# Patient Record
Sex: Female | Born: 1945 | Race: White | Hispanic: No | Marital: Married
Health system: Southern US, Community
[De-identification: ages and names within clinical notes are randomized; demographics above are authoritative.]

---

## 1998-04-23 ENCOUNTER — Ambulatory Visit (HOSPITAL_COMMUNITY): Admission: RE | Admit: 1998-04-23 | Discharge: 1998-04-23 | Payer: Self-pay | Admitting: Family Medicine

## 2012-09-12 DIAGNOSIS — M810 Age-related osteoporosis without current pathological fracture: Secondary | ICD-10-CM | POA: Insufficient documentation

## 2013-05-17 DIAGNOSIS — F430 Acute stress reaction: Secondary | ICD-10-CM | POA: Insufficient documentation

## 2013-06-12 DIAGNOSIS — G4700 Insomnia, unspecified: Secondary | ICD-10-CM | POA: Insufficient documentation

## 2013-06-12 DIAGNOSIS — F329 Major depressive disorder, single episode, unspecified: Secondary | ICD-10-CM | POA: Insufficient documentation

## 2013-06-12 DIAGNOSIS — F32A Depression, unspecified: Secondary | ICD-10-CM | POA: Insufficient documentation

## 2013-08-18 DIAGNOSIS — E039 Hypothyroidism, unspecified: Secondary | ICD-10-CM | POA: Insufficient documentation

## 2014-03-23 DIAGNOSIS — D72819 Decreased white blood cell count, unspecified: Secondary | ICD-10-CM | POA: Insufficient documentation

## 2014-04-05 DIAGNOSIS — R945 Abnormal results of liver function studies: Secondary | ICD-10-CM | POA: Insufficient documentation

## 2014-04-05 DIAGNOSIS — N390 Urinary tract infection, site not specified: Secondary | ICD-10-CM | POA: Insufficient documentation

## 2014-04-05 DIAGNOSIS — R7989 Other specified abnormal findings of blood chemistry: Secondary | ICD-10-CM | POA: Insufficient documentation

## 2014-04-06 DIAGNOSIS — R102 Pelvic and perineal pain: Secondary | ICD-10-CM | POA: Insufficient documentation

## 2014-07-06 DIAGNOSIS — K760 Fatty (change of) liver, not elsewhere classified: Secondary | ICD-10-CM | POA: Insufficient documentation

## 2015-06-26 ENCOUNTER — Ambulatory Visit (INDEPENDENT_AMBULATORY_CARE_PROVIDER_SITE_OTHER): Payer: Medicare Other

## 2015-06-26 ENCOUNTER — Ambulatory Visit (INDEPENDENT_AMBULATORY_CARE_PROVIDER_SITE_OTHER): Payer: Medicare Other | Admitting: Podiatry

## 2015-06-26 ENCOUNTER — Encounter: Payer: Self-pay | Admitting: Podiatry

## 2015-06-26 VITALS — BP 154/88 | HR 73 | Resp 16 | Ht 60.0 in | Wt 130.0 lb

## 2015-06-26 DIAGNOSIS — M79674 Pain in right toe(s): Secondary | ICD-10-CM

## 2015-06-26 DIAGNOSIS — M779 Enthesopathy, unspecified: Secondary | ICD-10-CM | POA: Diagnosis not present

## 2015-06-26 NOTE — Progress Notes (Signed)
   Subjective:    Patient ID: Rachel Mcmillan, female    DOB: July 09, 1946, 69 y.o.   MRN: [id number]  HPI: She presents today with a 6 month history of pain to the second metatarsophalangeal joint of the right foot. She states that it developed soon after training for a 5K. She states that she has seen a chiropractor on a regular basis to help alleviate her symptoms but has failed to do so.    Review of Systems  All other systems reviewed and are negative.      Objective:   Physical Exam: 69 year old white female in no acute distress signs are stable she is alert and oriented 3 presents today for a chief complaint of painful second metatarsophalangeal joint right. Pulses are strongly palpable. Neurologic sensorium is intact per Semmes-Weinstein monofilament. A tendon reflexes are intact bilateral and muscle strength is 5 over 5 dorsiflexion plantar flexors and inverters everters all intrinsic musculature is intact. Orthopedic evaluation demonstrates all joints distal to the ankle for range of motion without crepitation. She has pain on palpation particularly end range of motion of the second metatarsophalangeal joint and digit of the right foot. Radiographs taken today in the office 3 views of the right foot demonstrates osseously mature foot with a slightly elongated second metatarsal which appears to have some blunting at its articular surface. This could be an early Freiberg's infarction or early osteoarthritic changes. Cutaneous evaluation demonstrates supple well-hydrated cutis no erythema edema cellulitis drainage or odor. Nail plates appear to be normal and no open wounds or lesions.        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  Plan: Discussed etiology pathology conservative versus surgical therapies. Discussed appropriate shoe gear stretching a size ice therapy and shoe gear modifications. She does not  like oral medications so I offered her an intra-articular injection of dexamethasone. She agreed to this and this was performed today after Betadine skin prep was performed. I injected 2 mg of dexamethasone to the point of maximal tenderness within the second metatarsophalangeal joint. I reiterated the need for appropriate shoe gear. I will follow-up with her and her mother in 4 weeks.

## 2015-07-16 ENCOUNTER — Encounter: Payer: Self-pay | Admitting: Podiatry

## 2015-07-16 ENCOUNTER — Ambulatory Visit (INDEPENDENT_AMBULATORY_CARE_PROVIDER_SITE_OTHER): Payer: Medicare Other | Admitting: Podiatry

## 2015-07-16 VITALS — BP 121/68 | HR 74 | Resp 16

## 2015-07-16 DIAGNOSIS — M779 Enthesopathy, unspecified: Secondary | ICD-10-CM | POA: Diagnosis not present

## 2015-07-16 NOTE — Progress Notes (Signed)
She presents today one month status post injection of the second metatarsophalangeal joint right foot for capsulitis. She states that he still feels a little tender. She states that she has purchased new shoes with stiff soles.  Objective: Vital signs are stable she is alert and oriented 3. Pulses are strongly palpable bilateral. Neurologic sensorium is intact per Semmes-Weinstein monofilament. Deep tendon reflexes are intact bilateral and muscle strength +5 over 5 dorsiflexion plantar flexors and inverters everters all intrinsic musculature is intact. Orthopedic evaluation demonstrates all joints distal to the ankle for range of motion without crepitation with the exception of some mild tenderness on in range of motion at the second metatarsophalangeal joint and some soft tissue swelling over that joint.  Assessment: Residual capsulitis 50% resolved second metatarsophalangeal joint right foot.  Plan: Injected peri-articularly today about the second metatarsophalangeal joint right foot. Otherwise she will continue anti-inflammatory medication and her stiff soled shoes. I will follow up with her in 1 month.

## 2015-07-17 ENCOUNTER — Ambulatory Visit: Payer: Medicare Other | Admitting: Podiatry

## 2015-08-27 ENCOUNTER — Telehealth: Payer: Self-pay | Admitting: *Deleted

## 2015-08-27 ENCOUNTER — Ambulatory Visit: Payer: Medicare Other | Admitting: Podiatry

## 2015-08-27 ENCOUNTER — Encounter: Payer: Self-pay | Admitting: Podiatry

## 2015-08-27 ENCOUNTER — Ambulatory Visit (INDEPENDENT_AMBULATORY_CARE_PROVIDER_SITE_OTHER): Payer: Medicare Other | Admitting: Podiatry

## 2015-08-27 VITALS — BP 117/72 | HR 79 | Resp 16

## 2015-08-27 DIAGNOSIS — M779 Enthesopathy, unspecified: Secondary | ICD-10-CM

## 2015-08-27 MED ORDER — DICLOFENAC SODIUM 1 % TD GEL: 4.0000 g | Freq: Four times a day (QID) | TRANSDERMAL | Status: AC

## 2015-08-27 NOTE — Telephone Encounter (Addendum)
-----   Message from Kristian CoveyAshley E Prevette, [facility] sent at 08/27/2015  1:53 PM EDT ----- Regarding: Sched MRI Orders are in and this patient wants [place] [facility]. I picked that option in the computer, wasn't sure it would show on the order! Thanks!!  Orders faxed to [facility] ph [phone], fax [phone].

## 2015-08-27 NOTE — Progress Notes (Signed)
She presents today for follow-up of capsulitis second digit of the right foot. She states it is so to check and hardly wear shoes. All conservative therapies have failed including steroid nonsteroidals nonsteroidals injectables.  Objective: She has pain on palpation and range of motion of the second metatarsophalangeal joint of the right foot E medial deviation of the toe was noted.  Assessment: Suspect a plantar plate tear and capsulitis of the second metatarsophalangeal joint of the right foot.  Plan: I'm recommending an MRI of the foot for surgical consideration. Remember to take her for the pie. Also dispensed a prescription for diclofenac gel. Follow up with her with the results come in.

## 2015-09-09 ENCOUNTER — Ambulatory Visit (INDEPENDENT_AMBULATORY_CARE_PROVIDER_SITE_OTHER): Payer: Medicare Other

## 2015-09-09 ENCOUNTER — Other Ambulatory Visit: Payer: Self-pay

## 2015-09-09 ENCOUNTER — Encounter: Payer: Self-pay | Admitting: Podiatry

## 2015-09-09 DIAGNOSIS — M71571 Other bursitis, not elsewhere classified, right ankle and foot: Secondary | ICD-10-CM | POA: Diagnosis not present

## 2015-09-09 DIAGNOSIS — M779 Enthesopathy, unspecified: Secondary | ICD-10-CM

## 2015-09-10 ENCOUNTER — Telehealth: Payer: Self-pay | Admitting: *Deleted

## 2015-09-10 NOTE — Telephone Encounter (Addendum)
-----   Message from Elinor ParkinsonMax T Hyatt, [place] sent at 09/09/2015  4:58 PM EDT ----- Inform patient of delay and please request an over read.  Left message informing pt of orders.  Faxed request for disc copy of MRI 09/09/2015 right foot.  Pt states after she had the MRI yesterday, [facility] in [place] called her back over and redid the MRI because the specific area was not covered.  I told pt the last report was sent after 422pm.  Mailed to [facility].

## 2015-09-19 ENCOUNTER — Telehealth: Payer: Self-pay | Admitting: *Deleted

## 2015-09-19 NOTE — Telephone Encounter (Signed)
Pt request MRI Results.  I called [facility] Erskine Squibb- Jane states MRI disc copy was received on 09/18/2015, and she will push the reading up earlier.  I told pt.

## 2015-09-24 ENCOUNTER — Telehealth: Payer: Self-pay | Admitting: *Deleted

## 2015-09-24 NOTE — Telephone Encounter (Signed)
Pt called for MRI results [facility].  I told pt [facility] had put a rush on the reading of the MRI.

## 2015-10-16 ENCOUNTER — Encounter: Payer: Self-pay | Admitting: Podiatry

## 2015-10-16 ENCOUNTER — Ambulatory Visit (INDEPENDENT_AMBULATORY_CARE_PROVIDER_SITE_OTHER): Payer: Medicare Other | Admitting: Podiatry

## 2015-10-16 VITALS — BP 123/67 | HR 75 | Resp 18

## 2015-10-16 DIAGNOSIS — M779 Enthesopathy, unspecified: Secondary | ICD-10-CM | POA: Diagnosis not present

## 2015-10-16 DIAGNOSIS — M2041 Other hammer toe(s) (acquired), right foot: Secondary | ICD-10-CM | POA: Diagnosis not present

## 2015-10-16 NOTE — Patient Instructions (Signed)
Pre-Operative Instructions  Congratulations, you have decided to take an important step to improving your quality of life.  You can be assured that the doctors of [facility] will be with you every step of the way.  1. Plan to be at the surgery center/hospital at least 1 (one) hour prior to your scheduled time unless otherwise directed by the surgical center/hospital staff.  You must have a responsible adult accompany you, remain during the surgery and drive you home.  Make sure you have directions to the surgical center/hospital and know how to get there on time. 2. For hospital based surgery you will need to obtain a history and physical form from your family physician within 1 month prior to the date of surgery- we will give you a form for you primary physician.  3. We make every effort to accommodate the date you request for surgery.  There are however, times where surgery dates or times have to be moved.  We will contact you as soon as possible if a change in schedule is required.   4. No Aspirin/Ibuprofen for one week before surgery.  If you are on aspirin, any non-steroidal anti-inflammatory medications (Mobic, Aleve, Ibuprofen) you should stop taking it 7 days prior to your surgery.  You make take Tylenol  For pain prior to surgery.  5. Medications- If you are taking daily heart and blood pressure medications, seizure, reflux, allergy, asthma, anxiety, pain or diabetes medications, make sure the surgery center/hospital is aware before the day of surgery so they may notify you which medications to take or avoid the day of surgery. 6. No food or drink after midnight the night before surgery unless directed otherwise by surgical center/hospital staff. 7. No alcoholic beverages 24 hours prior to surgery.  No smoking 24 hours prior to or 24 hours after surgery. 8. Wear loose pants or shorts- loose enough to fit over bandages, boots, and casts. 9. No slip on shoes, sneakers are best. 10. Bring  your boot with you to the surgery center/hospital.  Also bring crutches or a walker if your physician has prescribed it for you.  If you do not have this equipment, it will be provided for you after surgery. 11. If you have not been contracted by the surgery center/hospital by the day before your surgery, call to confirm the date and time of your surgery. 12. Leave-time from work may vary depending on the type of surgery you have.  Appropriate arrangements should be made prior to surgery with your employer. 13. Prescriptions will be provided immediately following surgery by your doctor.  Have these filled as soon as possible after surgery and take the medication as directed. 14. Remove nail polish on the operative foot. 15. Wash the night before surgery.  The night before surgery wash the foot and leg well with the antibacterial soap provided and water paying special attention to beneath the toenails and in between the toes.  Rinse thoroughly with water and dry well with a towel.  Perform this wash unless told not to do so by your physician.  Enclosed: 1 Ice pack (please put in freezer the night before surgery)   1 Hibiclens skin cleaner   Pre-op Instructions  If you have any questions regarding the instructions, do not hesitate to call our office.  [place]: [address] [place], [place] [address] [phone]  [place]: [address] [place], [place] [address] [phone]  [place]: [address]  [place], [place] [address] [phone]  Dr. Richard   Tuchman DPM, Dr. Norman Regal DPM Dr. Richard Sikora DPM, Dr. M. Todd Hyatt DPM, Dr. Kathryn Egerton DPM 

## 2015-10-16 NOTE — Progress Notes (Signed)
She presents today for follow-up of her MRI report right foot. She states that I have good days and bad days or bad than good. No change in her past medical history medications allergies surgeries or social history.  Objective: Vital signs are stable alert and oriented 3. Pulses are strongly palpable. Neurologic sensorium is intact. She has pain on range of motion and on palpation of the second metatarsophalangeal joint right foot. MRI confirms partial rupture of the second metatarsophalangeal joint and plantar plate.  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  Plan: We discussed the etiology pathology conservative versus surgical therapies. We consented her today for a second metatarsal osteotomy with 2 screw fixation. I answered these questions to the best of my ability in layman's terms. We did discuss the possible postop complications which may include but are not limited to postop pain bleeding swelling infection recurrence and need for further surgery loss of digit muscle limb loss of life. I will follow-up with her in January for surgery.

## 2015-11-15 ENCOUNTER — Telehealth: Payer: Self-pay | Admitting: *Deleted

## 2015-11-15 NOTE — Telephone Encounter (Signed)
"  Someone was supposed to call me about scheduling surgery."  When would you like to schedule?  "Do you have anything available not next week but the week after?"  No, he doesn't have anything on the 20th.  He can do it on 12/13/2015.  "I'm not home let me check my calendar and I'll call you back on Monday.  Put me down tentatively."

## 2015-11-18 NOTE — Telephone Encounter (Signed)
"  I want to make sure I'm scheduled for surgery on February 3rd."  Yes, we have you scheduled for the February 3rd.  "Is there anything else I need to do?"  You need to register with the surgical center, you can do it online or call.  "He didn't go over that, he just gave me the brochure."  Instructions are in the brochure, you can do either online or phone them.  "I'll just schedule it online."

## 2015-12-10 ENCOUNTER — Telehealth: Payer: Self-pay | Admitting: *Deleted

## 2015-12-10 NOTE — Telephone Encounter (Addendum)
Pt states she is scheduled for surgery on 12/13/2015 and has questions.  12/30/2015-PT LEFT NAME, DOB, AND PHONE NUMBER.  I called and pt states she feels she is going to fall in the surgical shoe because it is too large, her toes are completely under the broad velcro strap and her foot slides sideways when she walks.  I told pt it would be worth her try another size and would have an assistant from the [place] office call to let her know when a staff member would be able to help her fit another shoe.  Pt agreed.

## 2015-12-12 ENCOUNTER — Other Ambulatory Visit: Payer: Self-pay | Admitting: Podiatry

## 2015-12-12 MED ORDER — CEPHALEXIN 500 MG PO CAPS: 500.0000 mg | ORAL_CAPSULE | Freq: Three times a day (TID) | ORAL | Status: DC

## 2015-12-12 MED ORDER — PROMETHAZINE HCL 25 MG PO TABS: 25.0000 mg | ORAL_TABLET | Freq: Three times a day (TID) | ORAL | Status: AC | PRN

## 2015-12-12 MED ORDER — OXYCODONE-ACETAMINOPHEN 10-325 MG PO TABS: 1.0000 | ORAL_TABLET | ORAL | Status: AC | PRN

## 2015-12-13 ENCOUNTER — Encounter: Payer: Self-pay | Admitting: Podiatry

## 2015-12-13 DIAGNOSIS — M2041 Other hammer toe(s) (acquired), right foot: Secondary | ICD-10-CM | POA: Diagnosis not present

## 2015-12-13 DIAGNOSIS — M21541 Acquired clubfoot, right foot: Secondary | ICD-10-CM

## 2015-12-18 ENCOUNTER — Ambulatory Visit (INDEPENDENT_AMBULATORY_CARE_PROVIDER_SITE_OTHER): Payer: Medicare Other | Admitting: Podiatry

## 2015-12-18 ENCOUNTER — Encounter: Payer: Self-pay | Admitting: Podiatry

## 2015-12-18 ENCOUNTER — Ambulatory Visit (INDEPENDENT_AMBULATORY_CARE_PROVIDER_SITE_OTHER): Payer: Medicare Other

## 2015-12-18 VITALS — BP 149/79 | HR 80 | Resp 16

## 2015-12-18 DIAGNOSIS — Z9889 Other specified postprocedural states: Secondary | ICD-10-CM

## 2015-12-18 DIAGNOSIS — M2041 Other hammer toe(s) (acquired), right foot: Secondary | ICD-10-CM

## 2015-12-18 DIAGNOSIS — M779 Enthesopathy, unspecified: Secondary | ICD-10-CM

## 2015-12-18 NOTE — Progress Notes (Signed)
Rachel Mcmillan presents today 1 week status post second metatarsal osteotomy and hammertoe repair fifth right right foot. Rachel Mcmillan denies fever chills nausea vomiting muscle aches and pains and states that Rachel Mcmillan's been doing pretty well.  Objective: Vital signs are stable Rachel Mcmillan is alert and oriented 3. Pulses are strongly palpable. Dry sterile dressing was removed demonstrates mild edema no erythema cellulitis drainage or odor. Incision sites appear to be healing very well fifth toe is rectus in good position as is the second toe.  Assessment: Well-healing surgical foot status post 1 week second metatarsal osteotomy and fifth hammertoe right foot.  Plan: [place] today with a dry sterile compressive dressing keep this dry and clean elevate as much as possible and I will follow-up with her in 1 week

## 2015-12-25 ENCOUNTER — Ambulatory Visit (INDEPENDENT_AMBULATORY_CARE_PROVIDER_SITE_OTHER): Payer: Medicare Other | Admitting: Podiatry

## 2015-12-25 DIAGNOSIS — M2041 Other hammer toe(s) (acquired), right foot: Secondary | ICD-10-CM | POA: Diagnosis not present

## 2015-12-25 DIAGNOSIS — Z9889 Other specified postprocedural states: Secondary | ICD-10-CM | POA: Diagnosis not present

## 2015-12-25 DIAGNOSIS — M79673 Pain in unspecified foot: Secondary | ICD-10-CM

## 2015-12-25 NOTE — Progress Notes (Signed)
She presents today or her 2 week follow-up visit status post second metatarsal osteotomy derotational arthroplasty fifth toe right foot. She denies fever chills nausea vomiting muscle aches and pains and states that it seems to be doing well.  Objective: Vital signs are stable alert and oriented 3. Sutures were removed today and margins remain well coapted. She has good range of motion of the second metatarsophalangeal joint is somewhat tender on plantar flexion.  Assessment: Well-healing surgical foot right.  Plan: Placed her in a compression anklet as well as a Darco digital splint. I will follow up with her in 2 weeks she will also utilize a Darco shoe.

## 2016-01-15 ENCOUNTER — Encounter: Payer: Self-pay | Admitting: Podiatry

## 2016-01-15 ENCOUNTER — Ambulatory Visit (INDEPENDENT_AMBULATORY_CARE_PROVIDER_SITE_OTHER): Payer: Medicare Other | Admitting: Podiatry

## 2016-01-15 ENCOUNTER — Encounter: Payer: Medicare Other | Admitting: Podiatry

## 2016-01-15 ENCOUNTER — Ambulatory Visit (INDEPENDENT_AMBULATORY_CARE_PROVIDER_SITE_OTHER): Payer: Medicare Other

## 2016-01-15 DIAGNOSIS — Z9889 Other specified postprocedural states: Secondary | ICD-10-CM | POA: Diagnosis not present

## 2016-01-15 DIAGNOSIS — E785 Hyperlipidemia, unspecified: Secondary | ICD-10-CM | POA: Insufficient documentation

## 2016-01-15 DIAGNOSIS — E039 Hypothyroidism, unspecified: Secondary | ICD-10-CM | POA: Insufficient documentation

## 2016-01-15 DIAGNOSIS — M2041 Other hammer toe(s) (acquired), right foot: Secondary | ICD-10-CM | POA: Diagnosis not present

## 2016-01-15 NOTE — Progress Notes (Signed)
She presents today for follow-up of her second metatarsal osteotomy right foot. She states this seems to be doing pretty well except a little bit tender right ear she points to the very small dark spot. She states she is ready for some regular shoes.  Objective: All signs are stable she is alert and oriented 3. Pulses are palpable. Evaluation of the surgical foot demonstrates well-healing surgical foot she has good range of motion. The small spot that she was [facility] in that being a small piece of suture that was remaining I removed that today which felt much better to her. Radiographs do demonstrate well-healing surgical osteotomy and no screw fixation second metatarsal right foot.  Assessment: Well-healing surgical foot right.  Plan: I will allow her to do back into regular shoe gear I did discuss precautions with her I will follow-up with her in 1 month for her next set of x-rays.

## 2016-01-21 NOTE — Progress Notes (Signed)
Patient ID: Margaret PyleCarolyn L Rosales, female   DOB: Apr 06, 1946, 70 y.o.   MRN: [id number] Dr Al CorpusHyatt Performed a 2nd metatarsal osteotomy with screw fixation Rt foot, Arthroplasty 5th toe Rt foot on 12/13/15

## 2016-02-12 ENCOUNTER — Ambulatory Visit (INDEPENDENT_AMBULATORY_CARE_PROVIDER_SITE_OTHER): Payer: Medicare Other

## 2016-02-12 ENCOUNTER — Encounter: Payer: Self-pay | Admitting: Podiatry

## 2016-02-12 ENCOUNTER — Ambulatory Visit (INDEPENDENT_AMBULATORY_CARE_PROVIDER_SITE_OTHER): Payer: Medicare Other | Admitting: Podiatry

## 2016-02-12 VITALS — BP 85/49 | HR 72 | Resp 18

## 2016-02-12 DIAGNOSIS — Z9889 Other specified postprocedural states: Secondary | ICD-10-CM | POA: Diagnosis not present

## 2016-02-12 DIAGNOSIS — M2041 Other hammer toe(s) (acquired), right foot: Secondary | ICD-10-CM | POA: Diagnosis not present

## 2016-02-12 NOTE — Progress Notes (Signed)
She presents today for follow-up of her second metatarsal osteotomy right date of surgery was 12/13/2015. She states that he seems to be doing okay however still swell some..  Objective: Vital signs stable alert and oriented 3. Pulses are palpable. She has some scar tissue overlying the second metatarsal osteotomy and there is some tenderness right at the level of the bend of the toe however it appears that she is healing quite nicely. Arthroplasty fifth digit appears to be healing well though there is some swelling of the fifth toe. Radiographs confirm well-healing osteotomy second metatarsal right and arthroplasty fifth right.  Assessment: Well-healing surgical foot right.  Plan: Continue range of motion exercises and massage therapy to help break up the scar tissue follow-up with me if there are questions or concerns.

## 2017-04-12 IMAGING — MR MR FOOT*R* W/O CM
5 series · 40 of 40 positions shown · non-contrast
Comparison: Radiographs 06/26/2015

CLINICAL DATA: Six-month history of right forefoot pain while
training for a road race.

EXAM:
MRI OF THE RIGHT FOREFOOT WITHOUT CONTRAST
TECHNIQUE: Multiplanar, multisequence MR imaging was performed. No intravenous
contrast was administered.

[Series 5: T1 · oblique · 3.0mm · 0.53mm/px · 9 of 35 slices shown (1 of 2)]
[im 1/35]
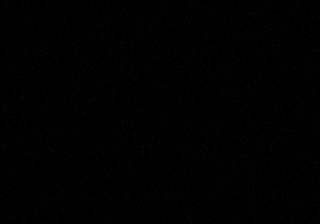
[im 5/35]
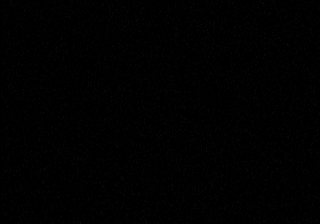
[im 9/35]
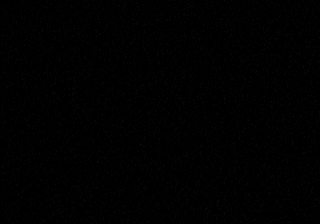
[im 13/35]
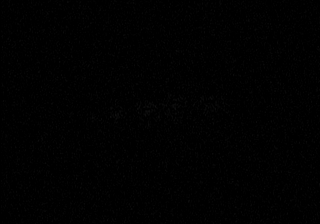
[im 18/35]
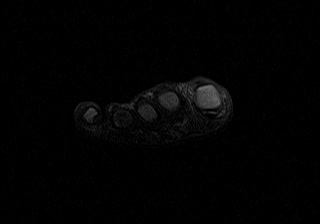
[im 22/35]
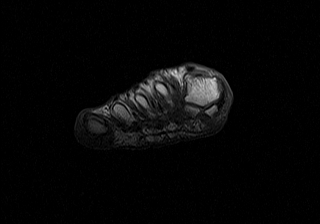
[im 26/35]
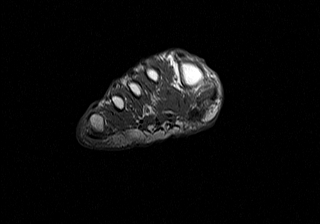
[im 30/35]
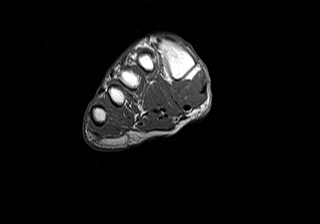
[im 35/35]
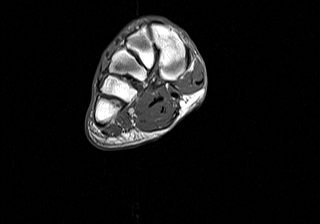

[Series 6: T2 fat-sat · oblique · 3.0mm · 0.53mm/px · 9 of 35 slices shown (1 of 3)]
[im 1/35]
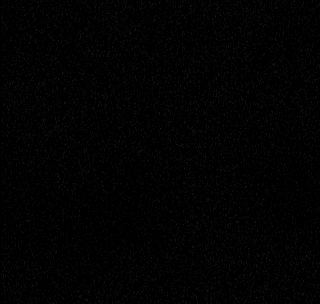
[im 5/35]
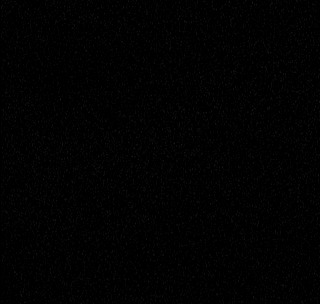
[im 9/35]
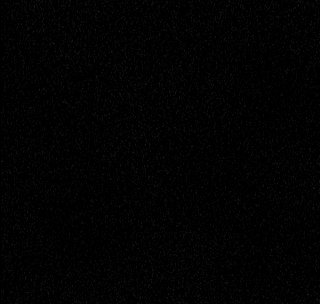
[im 13/35]
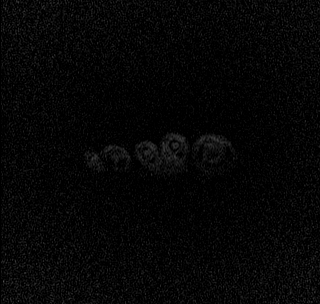
[im 18/35]
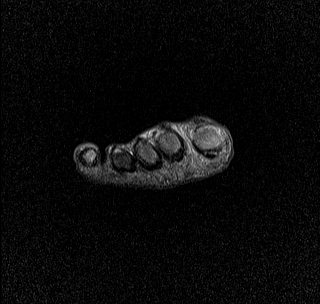
[im 22/35]
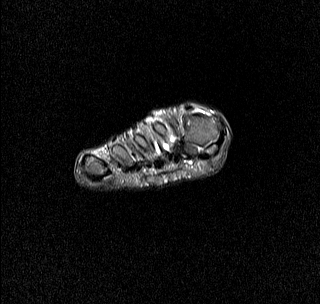
[im 26/35]
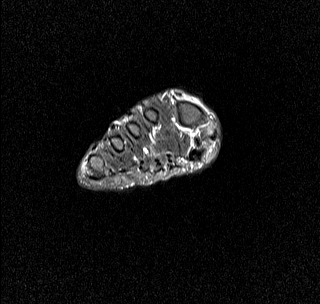
[im 30/35]
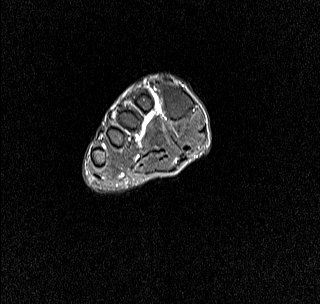
[im 35/35]
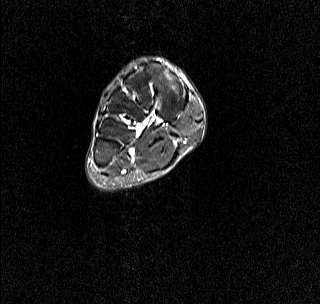

[Series 12: T1 · axial · 3.0mm · 0.53mm/px · z∈[-180,-94]mm · 7 of 30 slices shown (2 of 2)]
[im 1/30]
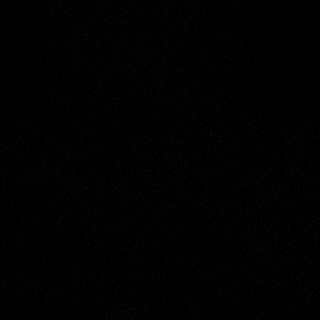
[im 5/30]
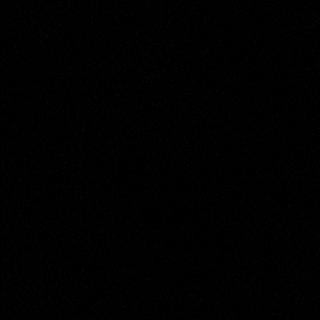
[im 10/30]
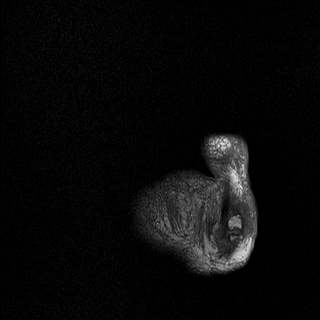
[im 15/30]
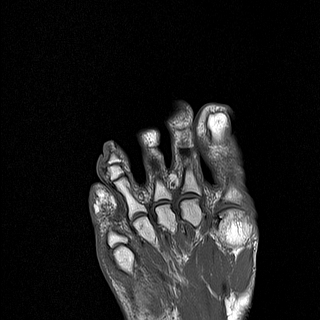
[im 20/30]
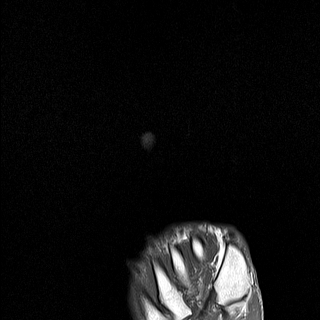
[im 25/30]
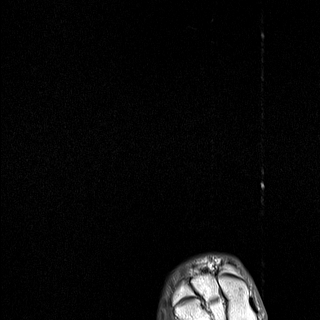
[im 30/30]
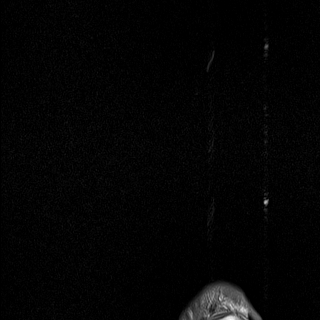

[Series 13: T2 fat-sat · axial · 3.0mm · 0.66mm/px · z∈[-180,-94]mm · 7 of 30 slices shown (2 of 3)]
[im 1/30]
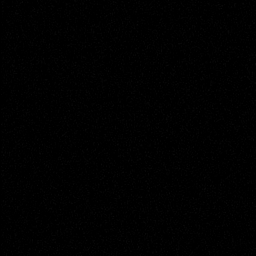
[im 5/30]
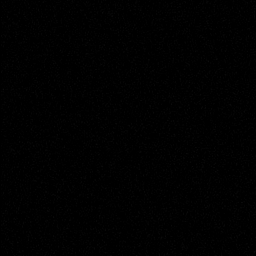
[im 10/30]
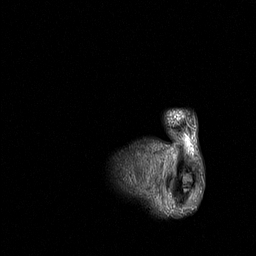
[im 15/30]
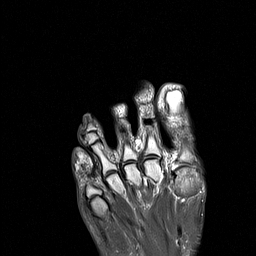
[im 20/30]
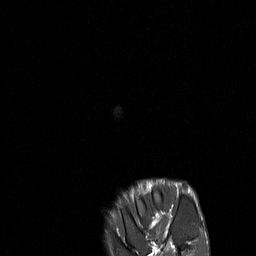
[im 25/30]
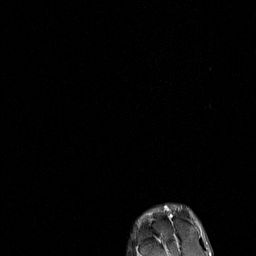
[im 30/30]
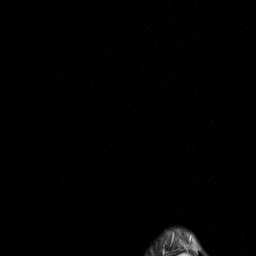

[Series 14: T2 fat-sat · sagittal · 3.0mm · 0.33mm/px · 8 of 31 slices shown (3 of 3)]
[im 1/31]
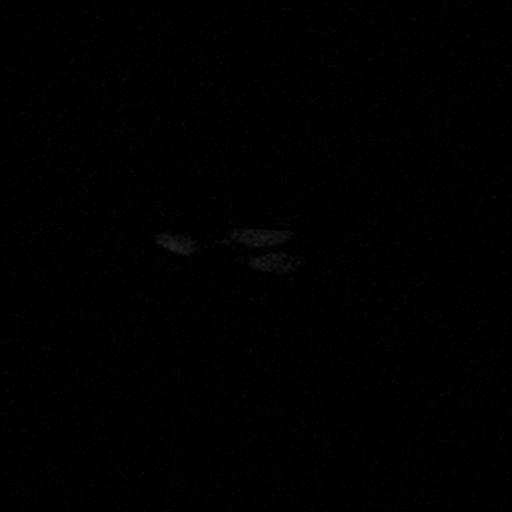
[im 5/31]
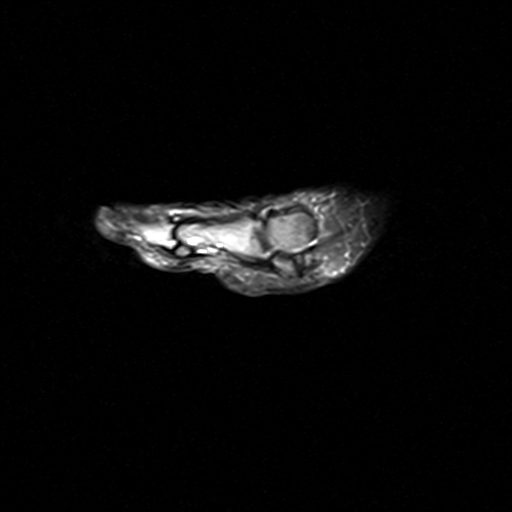
[im 9/31]
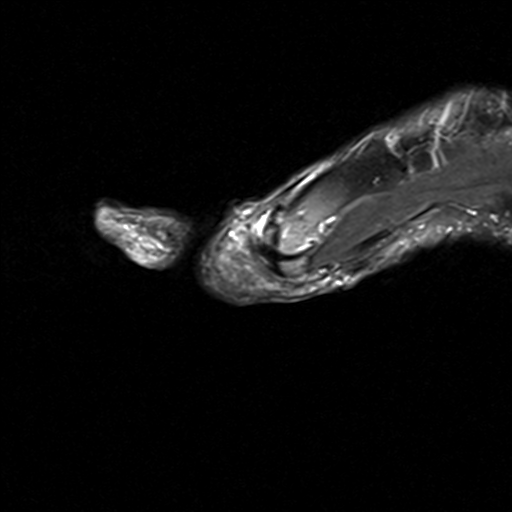
[im 13/31]
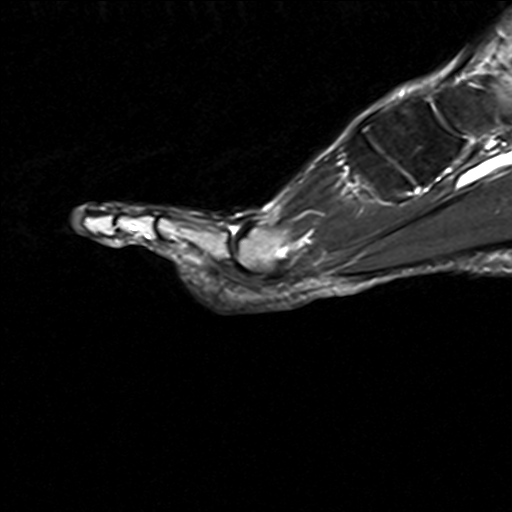
[im 18/31]
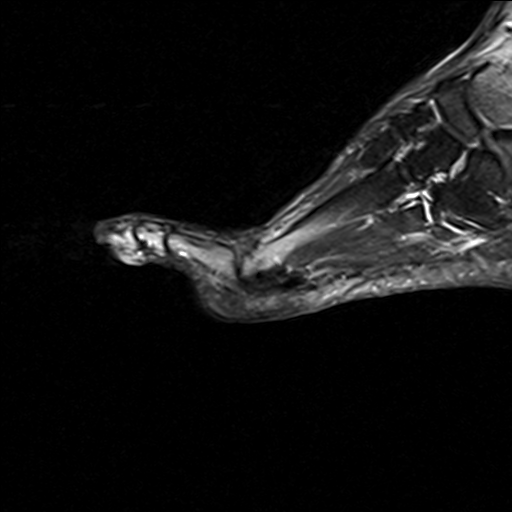
[im 22/31]
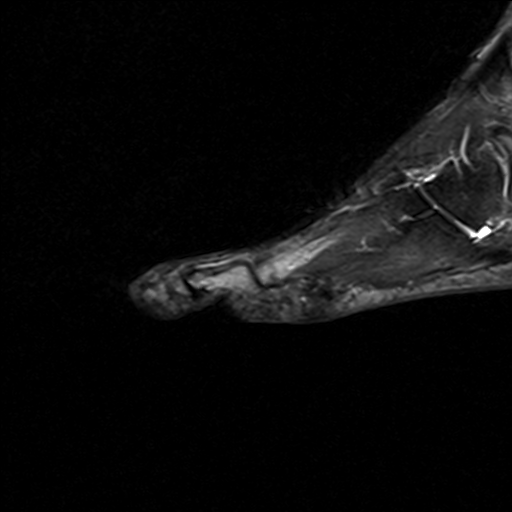
[im 26/31]
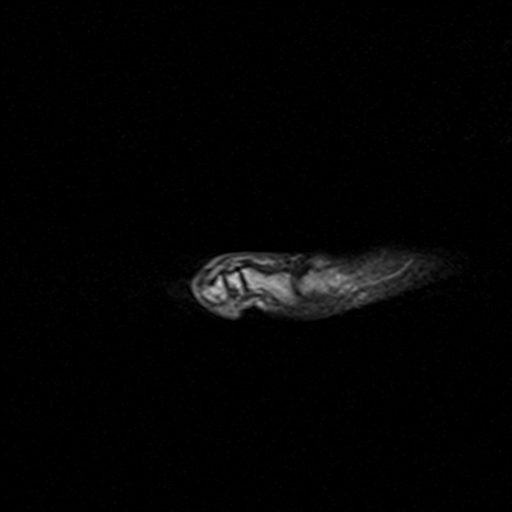
[im 31/31]
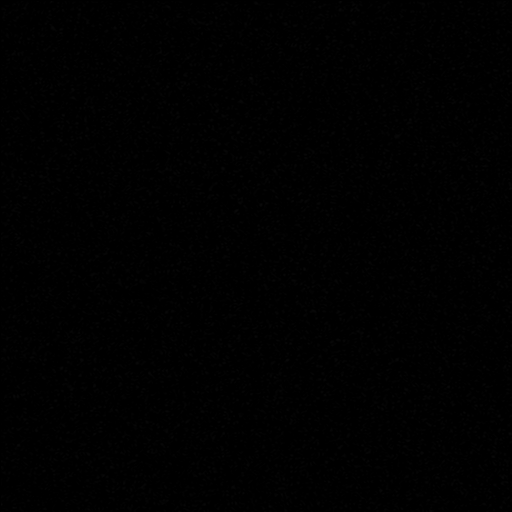

[40 of 40 positions shown; findings below may reference images not displayed]

FINDINGS: The joint spaces are maintained. Mild degenerative changes involving
the metatarsal phalangeal joints and interphalangeal joints but no
findings for osteonecrosis or stress fracture. No findings to
suggest Morton's neuroma. Mild intermetatarsal bursitis between the
first and second and second third metatarsal heads. The visualize
foot musculature is normal. A bipartite medial sesamoid of the great
toe is noted. There are mild degenerative changes at the first
metatarsal phalangeal joint without hallux valgus deformity.
IMPRESSION: Mild forefoot degenerative changes but no stress fracture or AVN.

Mild intermetatarsal bursitis between the first and second and
second and third metatarsal heads.
